# Patient Record
Sex: Female | Born: 1985 | Race: White | Hispanic: No | Marital: Single | Smoking: Former smoker
Health system: Southern US, Community
[De-identification: ages and names within clinical notes are randomized; demographics above are authoritative.]

## PROBLEM LIST (undated history)

## (undated) ENCOUNTER — Inpatient Hospital Stay (HOSPITAL_COMMUNITY): Payer: Self-pay

## (undated) DIAGNOSIS — Z789 Other specified health status: Secondary | ICD-10-CM

## (undated) HISTORY — PX: TONSILLECTOMY: SUR1361

## (undated) HISTORY — PX: NASAL SEPTUM SURGERY: SHX37

---

## 2015-08-19 ENCOUNTER — Inpatient Hospital Stay (HOSPITAL_COMMUNITY): Payer: Medicaid Other

## 2015-08-19 ENCOUNTER — Inpatient Hospital Stay (HOSPITAL_COMMUNITY)
Admission: AD | Admit: 2015-08-19 | Discharge: 2015-08-19 | Disposition: A | Discharge status: Home / Self Care | Payer: Medicaid Other | Source: Ambulatory Visit | Attending: Obstetrics & Gynecology | Admitting: Obstetrics & Gynecology

## 2015-08-19 ENCOUNTER — Encounter (HOSPITAL_COMMUNITY): Payer: Self-pay | Admitting: *Deleted

## 2015-08-19 DIAGNOSIS — N760 Acute vaginitis: Secondary | ICD-10-CM | POA: Insufficient documentation

## 2015-08-19 DIAGNOSIS — Z87891 Personal history of nicotine dependence: Secondary | ICD-10-CM | POA: Insufficient documentation

## 2015-08-19 DIAGNOSIS — O26899 Other specified pregnancy related conditions, unspecified trimester: Secondary | ICD-10-CM

## 2015-08-19 DIAGNOSIS — O23591 Infection of other part of genital tract in pregnancy, first trimester: Secondary | ICD-10-CM | POA: Diagnosis not present

## 2015-08-19 DIAGNOSIS — R102 Pelvic and perineal pain: Secondary | ICD-10-CM | POA: Diagnosis not present

## 2015-08-19 DIAGNOSIS — Z3A01 Less than 8 weeks gestation of pregnancy: Secondary | ICD-10-CM | POA: Insufficient documentation

## 2015-08-19 DIAGNOSIS — O3680X0 Pregnancy with inconclusive fetal viability, not applicable or unspecified: Secondary | ICD-10-CM

## 2015-08-19 DIAGNOSIS — R109 Unspecified abdominal pain: Secondary | ICD-10-CM

## 2015-08-19 DIAGNOSIS — O9989 Other specified diseases and conditions complicating pregnancy, childbirth and the puerperium: Secondary | ICD-10-CM

## 2015-08-19 HISTORY — DX: Other specified health status: Z78.9

## 2015-08-19 LAB — CBC
HCT: 39.1 % (ref 36.0–46.0)
Hemoglobin: 13.5 g/dL (ref 12.0–15.0)
MCH: 30.1 pg (ref 26.0–34.0)
MCHC: 34.5 g/dL (ref 30.0–36.0)
MCV: 87.3 fL (ref 78.0–100.0)
Platelets: 221 10*3/uL (ref 150–400)
RBC: 4.48 MIL/uL (ref 3.87–5.11)
RDW: 12.7 % (ref 11.5–15.5)
WBC: 11 10*3/uL — ABNORMAL HIGH (ref 4.0–10.5)

## 2015-08-19 LAB — URINALYSIS, ROUTINE W REFLEX MICROSCOPIC
BILIRUBIN URINE: NEGATIVE
Glucose, UA: NEGATIVE mg/dL
HGB URINE DIPSTICK: NEGATIVE
Ketones, ur: NEGATIVE mg/dL
Leukocytes, UA: NEGATIVE
Nitrite: NEGATIVE
PH: 5.5 (ref 5.0–8.0)
Protein, ur: NEGATIVE mg/dL

## 2015-08-19 LAB — WET PREP, GENITAL
Sperm: NONE SEEN
Trich, Wet Prep: NONE SEEN
Yeast Wet Prep HPF POC: NONE SEEN

## 2015-08-19 LAB — POCT PREGNANCY, URINE: Preg Test, Ur: POSITIVE — AB

## 2015-08-19 LAB — HCG, QUANTITATIVE, PREGNANCY: hCG, Beta Chain, Quant, S: 10120 m[IU]/mL — ABNORMAL HIGH (ref <5)

## 2015-08-19 LAB — ABO/RH: ABO/RH(D): A POS

## 2015-08-19 MED ORDER — METRONIDAZOLE 500 MG PO TABS: 500.0000 mg | ORAL_TABLET | Freq: Two times a day (BID) | ORAL | Status: DC

## 2015-08-19 NOTE — Discharge Instructions (Signed)
Human Chorionic Gonadotropin Test °Human chorionic gonadotropin (hCG) is a hormone produced during pregnancy by the cells that form the placenta. The placenta is the organ that grows inside your womb (uterus) to nourish a developing baby. When you are pregnant, hCG starts to appear in your blood about 11 days after conception. It continues to go up for the first 8-11 weeks of pregnancy.  °Your hCG level can be measured with several different types of tests. You may have: °· A urine test. °¨ hCG is eliminated from your body by your kidneys, so a urine test is one way to check for this hormone. °¨ A urine test only shows whether there is hCG in your urine. It does not measure how much. °¨ You may have a urine test to find out whether you are pregnant. °¨ A home pregnancy test detects whether there is hCG in your urine. °· A qualitative blood test. °¨ Like the urine test, this blood test only shows whether there is hCG in your blood. It does not measure how much. °¨ You may have this type of blood test to find out whether you are pregnant. °· A quantitative blood test. °¨ This type of blood test measures the amount of hCG in your blood. °¨ You may have this type of test to diagnose an abnormal pregnancy or determine whether you are at risk of, or have had, a failed pregnancy (miscarriage). °PREPARATION FOR TEST °For the urine test: °· Limit your fluid intake before the urine test as directed by your health care provider. °· Collect the sample the first time you urinate in the morning. °· Let your health care provider know if you have blood in your urine. This may interfere with the test result. °Some medicines may interfere with the urine and blood tests. Let your health care provider know about all the medicines you are taking. No additional preparation is required for the blood test.  °RESULTS °It is your responsibility to obtain your test results. Ask the lab or department performing the test when and how you will  get your results. Talk to your health care provider if you have any questions about your test results. °The results of the hCG urine test and the qualitative hCG blood test are either positive or negative. The results of the quantitative hCG blood test are reported as a number. hCG is measured in international units per liter (IU/L). °Meaning of Negative Test Results °A negative result on a urine or qualitative blood test could mean that you are not pregnant. It could also mean the test was done too early to detect hCG. If you still have other signs of pregnancy, the test should be repeated. °Meaning of Positive Test Results °A positive result on the urine or qualitative blood tests means you are most likely pregnant. Your health care provider may confirm your pregnancy with an imaging study of the inside of your uterus at 5-6 weeks (ultrasound).  °Range of Normal Values °Ranges for normal values for the quantitative hCG blood test may vary among different labs and hospitals. You should always check with your health care provider after having lab work or other tests done to discuss whether your values are considered within normal limits.  °· Less than 5 IU/L means it is most likely you are not pregnant. °· Greater than 25 IU/L means it is most likely you are pregnant. °Meaning of Results Outside Normal Value Ranges °If your hCG level on the quantitative test is not   what would be expected, you may have the test again. It may also be important for your health care provider to know whether your hCG level goes up or down over time. Common causes of results outside the normal range include:   Being pregnant with twins (hCG level is higher than expected).  Having an ectopic pregnancy (hCG rises more slowly than expected).  Miscarriage (hCG level falls).  Abnormal growths in the womb (hCG level is higher than expected).   This information is not intended to replace advice given to you by your health care  provider. Make sure you discuss any questions you have with your health care provider.   Document Released: 05/02/2004 Document Revised: 04/21/2014 Document Reviewed: 07/05/2013 Elsevier Interactive Patient Education 2016 [employer]. First Trimester of Pregnancy The first trimester of pregnancy is from week 1 until the end of week 12 (months 1 through 3). A week after a sperm fertilizes an egg, the egg will implant on the wall of the uterus. This embryo will begin to develop into a baby. Genes from you and your partner are forming the baby. The female genes determine whether the baby is a boy or a girl. At 6-8 weeks, the eyes and face are formed, and the heartbeat can be seen on ultrasound. At the end of 12 weeks, all the baby's organs are formed.  Now that you are pregnant, you will want to do everything you can to have a healthy baby. Two of the most important things are to get good prenatal care and to follow your health care provider's instructions. Prenatal care is all the medical care you receive before the baby's birth. This care will help prevent, find, and treat any problems during the pregnancy and childbirth. BODY CHANGES Your body goes through many changes during pregnancy. The changes vary from woman to woman.   You may gain or lose a couple of pounds at first.  You may feel sick to your stomach (nauseous) and throw up (vomit). If the vomiting is uncontrollable, call your health care provider.  You may tire easily.  You may develop headaches that can be relieved by medicines approved by your health care provider.  You may urinate more often. Painful urination may mean you have a bladder infection.  You may develop heartburn as a result of your pregnancy.  You may develop constipation because certain hormones are causing the muscles that push waste through your intestines to slow down.  You may develop hemorrhoids or swollen, bulging veins (varicose veins).  Your breasts may  begin to grow larger and become tender. Your nipples may stick out more, and the tissue that surrounds them (areola) may become darker.  Your gums may bleed and may be sensitive to brushing and flossing.  Dark spots or blotches (chloasma, mask of pregnancy) may develop on your face. This will likely fade after the baby is born.  Your menstrual periods will stop.  You may have a loss of appetite.  You may develop cravings for certain kinds of food.  You may have changes in your emotions from day to day, such as being excited to be pregnant or being concerned that something may go wrong with the pregnancy and baby.  You may have more vivid and strange dreams.  You may have changes in your hair. These can include thickening of your hair, rapid growth, and changes in texture. Some women also have hair loss during or after pregnancy, or hair that feels dry or thin.  Your hair will most likely return to normal after your baby is born. WHAT TO EXPECT AT YOUR PRENATAL VISITS During a routine prenatal visit:  You will be weighed to make sure you and the baby are growing normally.  Your blood pressure will be taken.  Your abdomen will be measured to track your baby's growth.  The fetal heartbeat will be listened to starting around week 10 or 12 of your pregnancy.  Test results from any previous visits will be discussed. Your health care provider may ask you:  How you are feeling.  If you are feeling the baby move.  If you have had any abnormal symptoms, such as leaking fluid, bleeding, severe headaches, or abdominal cramping.  If you are using any tobacco products, including cigarettes, chewing tobacco, and electronic cigarettes.  If you have any questions. Other tests that may be performed during your first trimester include:  Blood tests to find your blood type and to check for the presence of any previous infections. They will also be used to check for low iron levels (anemia) and  Rh antibodies. Later in the pregnancy, blood tests for diabetes will be done along with other tests if problems develop.  Urine tests to check for infections, diabetes, or protein in the urine.  An ultrasound to confirm the proper growth and development of the baby.  An amniocentesis to check for possible genetic problems.  Fetal screens for spina bifida and Down syndrome.  You may need other tests to make sure you and the baby are doing well.  HIV (human immunodeficiency virus) testing. Routine prenatal testing includes screening for HIV, unless you choose not to have this test. HOME CARE INSTRUCTIONS  Medicines  Follow your health care provider's instructions regarding medicine use. Specific medicines may be either safe or unsafe to take during pregnancy.  Take your prenatal vitamins as directed.  If you develop constipation, try taking a stool softener if your health care provider approves. Diet  Eat regular, well-balanced meals. Choose a variety of foods, such as meat or vegetable-based protein, fish, milk and low-fat dairy products, vegetables, fruits, and whole grain breads and cereals. Your health care provider will help you determine the amount of weight gain that is right for you.  Avoid raw meat and uncooked cheese. These carry germs that can cause birth defects in the baby.  Eating four or five small meals rather than three large meals a day may help relieve nausea and vomiting. If you start to feel nauseous, eating a few soda crackers can be helpful. Drinking liquids between meals instead of during meals also seems to help nausea and vomiting.  If you develop constipation, eat more high-fiber foods, such as fresh vegetables or fruit and whole grains. Drink enough fluids to keep your urine clear or pale yellow. Activity and Exercise  Exercise only as directed by your health care provider. Exercising will help you:  Control your weight.  Stay in shape.  Be prepared for  labor and delivery.  Experiencing pain or cramping in the lower abdomen or low back is a good sign that you should stop exercising. Check with your health care provider before continuing normal exercises.  Try to avoid standing for long periods of time. Move your legs often if you must stand in one place for a long time.  Avoid heavy lifting.  Wear low-heeled shoes, and practice good posture.  You may continue to have sex unless your health care provider directs you otherwise. Relief  of Pain or Discomfort  Wear a good support bra for breast tenderness.   Take warm sitz baths to soothe any pain or discomfort caused by hemorrhoids. Use hemorrhoid cream if your health care provider approves.   Rest with your legs elevated if you have leg cramps or low back pain.  If you develop varicose veins in your legs, wear support hose. Elevate your feet for 15 minutes, 3-4 times a day. Limit salt in your diet. Prenatal Care  Schedule your prenatal visits by the twelfth week of pregnancy. They are usually scheduled monthly at first, then more often in the last 2 months before delivery.  Write down your questions. Take them to your prenatal visits.  Keep all your prenatal visits as directed by your health care provider. Safety  Wear your seat belt at all times when driving.  Make a list of emergency phone numbers, including numbers for family, friends, the hospital, and police and fire departments. General Tips  Ask your health care provider for a referral to a local prenatal education class. Begin classes no later than at the beginning of month 6 of your pregnancy.  Ask for help if you have counseling or nutritional needs during pregnancy. Your health care provider can offer advice or refer you to specialists for help with various needs.  Do not use hot tubs, steam rooms, or saunas.  Do not douche or use tampons or scented sanitary pads.  Do not cross your legs for long periods of  time.  Avoid cat litter boxes and soil used by cats. These carry germs that can cause birth defects in the baby and possibly loss of the fetus by miscarriage or stillbirth.  Avoid all smoking, herbs, alcohol, and medicines not prescribed by your health care provider. Chemicals in these affect the formation and growth of the baby.  Do not use any tobacco products, including cigarettes, chewing tobacco, and electronic cigarettes. If you need help quitting, ask your health care provider. You may receive counseling support and other resources to help you quit.  Schedule a dentist appointment. At home, brush your teeth with a soft toothbrush and be gentle when you floss. SEEK MEDICAL CARE IF:   You have dizziness.  You have mild pelvic cramps, pelvic pressure, or nagging pain in the abdominal area.  You have persistent nausea, vomiting, or diarrhea.  You have a bad smelling vaginal discharge.  You have pain with urination.  You notice increased swelling in your face, hands, legs, or ankles. SEEK IMMEDIATE MEDICAL CARE IF:   You have a fever.  You are leaking fluid from your vagina.  You have spotting or bleeding from your vagina.  You have severe abdominal cramping or pain.  You have rapid weight gain or loss.  You vomit blood or material that looks like coffee grounds.  You are exposed to [place] measles and have never had them.  You are exposed to fifth disease or chickenpox.  You develop a severe headache.  You have shortness of breath.  You have any kind of trauma, such as from a fall or a car accident.   This information is not intended to replace advice given to you by your health care provider. Make sure you discuss any questions you have with your health care provider.   Document Released: 03/25/2001 Document Revised: 04/21/2014 Document Reviewed: 02/08/2013 Elsevier Interactive Patient Education [employer]

## 2015-08-19 NOTE — MAU Note (Signed)
Pt. States that she had some pink tinged discharge when using the bathroom. Pt. Is also having some lower abdominal cramping.

## 2015-08-19 NOTE — MAU Provider Note (Signed)
History     CSN: [id number]  Arrival date and time: 08/19/15 1526   None     Chief Complaint  Patient presents with  . Vaginal Bleeding   HPI Monique Cooper 30 y.o. G1P0 @ unknown LMP. Presents to the MAU stating that she had pink vaginal spotting from 1200 noon to 330pm today and lower abdominal cramping  Past Medical History  Diagnosis Date  . Medical history non-contributory     Past Surgical History  Procedure Laterality Date  . Tonsillectomy    . Nasal septum surgery      No family history on file.  Social History  Substance Use Topics  . Smoking status: Former Smoker    Quit date: 08/06/2015  . Smokeless tobacco: None  . Alcohol Use: No    Allergies: No Known Allergies  Prescriptions prior to admission  Medication Sig Dispense Refill Last Dose  . acetaminophen (TYLENOL) 500 MG tablet Take 1,000 mg by mouth every 6 (six) hours as needed for mild pain or headache.   Past Week at Unknown time  . Prenatal Vit-Fe Fumarate-FA (PRENATAL MULTIVITAMIN) TABS tablet Take 1 tablet by mouth daily.   08/19/2015 at Unknown time    Review of Systems  Constitutional: Negative for fever.  Gastrointestinal: Positive for abdominal pain.  Genitourinary:       Pink vaginal spotting.  All other systems reviewed and are negative.  Physical Exam   Blood pressure 143/76, pulse 82, temperature 99 F (37.2 C), temperature source Oral, height  (1.651 m), weight 225 lb 4 oz (102.173 kg), last menstrual period 08/08/2015.  Physical Exam  Nursing note and vitals reviewed. Constitutional: She is oriented to person, place, and time. She appears well-developed and well-nourished. No distress.  HENT:  Head: Normocephalic and atraumatic.  Cardiovascular: Normal rate and regular rhythm.   Respiratory: Effort normal and breath sounds normal. No respiratory distress.  GI: Soft. She exhibits no distension and no mass. There is no tenderness. There is no rebound and no guarding.   Genitourinary: Vagina normal. No vaginal discharge found.  Musculoskeletal: Normal range of motion.  Neurological: She is alert and oriented to person, place, and time.  Skin: Skin is warm and dry.  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 Her behavior is normal. Judgment and thought content normal.   US Ob Comp Less 14 Wks  08/19/2015  CLINICAL DATA:  30 year old pregnant female with pelvic pain and discharge. Uncertain LMP. EXAM: OBSTETRIC <14 WK [place] AND TRANSVAGINAL OB US TECHNIQUE: Both transabdominal and transvaginal ultrasound examinations were performed for complete evaluation of the gestation as well as the maternal uterus, adnexal regions, and pelvic cul-de-sac. Transvaginal technique was performed to assess early pregnancy. COMPARISON:  None. FINDINGS: Intrauterine gestational sac: Visualized/normal in shape. Yolk sac:  Visualized Embryo:  Not visualized Cardiac Activity: Not visualized MSD: 11  mm   5 w   6  d            [place] EDC: 04/14/2016 Maternal uterus/adnexae: There is no evidence of subchorionic hemorrhage. The ovaries bilaterally are unremarkable. There is no evidence of adnexal mass or free fluid. IMPRESSION: Single intrauterine gestational sac and yolk sac with estimated gestational age of [redacted] weeks 6 days by this ultrasound. Embryo not identified at this time. No evidence of subchorionic hemorrhage. Unremarkable ovaries. Electronically Signed   By: Harmon Pier M.D.   On: 08/19/2015 17:59   US Ob Transvaginal  08/19/2015  CLINICAL  DATA:  30 year old pregnant female with pelvic pain and discharge. Uncertain LMP. EXAM: OBSTETRIC <14 WK [place] AND TRANSVAGINAL OB US TECHNIQUE: Both transabdominal and transvaginal ultrasound examinations were performed for complete evaluation of the gestation as well as the maternal uterus, adnexal regions, and pelvic cul-de-sac. Transvaginal technique was performed to assess early pregnancy. COMPARISON:  None. FINDINGS:  Intrauterine gestational sac: Visualized/normal in shape. Yolk sac:  Visualized Embryo:  Not visualized Cardiac Activity: Not visualized MSD: 11  mm   5 w   6  d            [place] EDC: 04/14/2016 Maternal uterus/adnexae: There is no evidence of subchorionic hemorrhage. The ovaries bilaterally are unremarkable. There is no evidence of adnexal mass or free fluid. IMPRESSION: Single intrauterine gestational sac and yolk sac with estimated gestational age of [redacted] weeks 6 days by this ultrasound. Embryo not identified at this time. No evidence of subchorionic hemorrhage. Unremarkable ovaries. Electronically Signed   By: Harmon Pier M.D.   On: 08/19/2015 17:59   Results for orders placed or performed during the hospital encounter of 08/19/15 (from the past 24 hour(s))  Urinalysis, Routine w reflex microscopic (not at [facility])     Status: Abnormal   Collection Time: 08/19/15  3:35 PM  Result Value Ref Range   Color, Urine YELLOW YELLOW   APPearance CLEAR CLEAR   Specific Gravity, Urine <1.005 (L) 1.005 - 1.030   pH 5.5 5.0 - 8.0   Glucose, UA NEGATIVE NEGATIVE mg/dL   Hgb urine dipstick NEGATIVE NEGATIVE   Bilirubin Urine NEGATIVE NEGATIVE   Ketones, ur NEGATIVE NEGATIVE mg/dL   Protein, ur NEGATIVE NEGATIVE mg/dL   Nitrite NEGATIVE NEGATIVE   Leukocytes, UA NEGATIVE NEGATIVE  Pregnancy, urine POC     Status: Abnormal   Collection Time: 08/19/15  3:40 PM  Result Value Ref Range   Preg Test, Ur POSITIVE (A) NEGATIVE  CBC     Status: Abnormal   Collection Time: 08/19/15  4:52 PM  Result Value Ref Range   WBC 11.0 (H) 4.0 - 10.5 K/uL   RBC 4.48 3.87 - 5.11 MIL/uL   Hemoglobin 13.5 12.0 - 15.0 g/dL   HCT 16.1 09.6 - 04.5 %   MCV 87.3 78.0 - 100.0 fL   MCH 30.1 26.0 - 34.0 pg   MCHC 34.5 30.0 - 36.0 g/dL   RDW 40.9 81.1 - 91.4 %   Platelets 221 150 - 400 K/uL  hCG, quantitative, pregnancy     Status: Abnormal   Collection Time: 08/19/15  4:52 PM  Result Value Ref Range   hCG, Beta Chain, Quant, S 10120  (H) <5 mIU/mL  ABO/Rh     Status: None (Preliminary result)   Collection Time: 08/19/15  4:52 PM  Result Value Ref Range   ABO/RH(D) A POS   Wet prep, genital     Status: Abnormal   Collection Time: 08/19/15  6:00 PM  Result Value Ref Range   Yeast Wet Prep HPF POC NONE SEEN NONE SEEN   Trich, Wet Prep NONE SEEN NONE SEEN   Clue Cells Wet Prep HPF POC PRESENT (A) NONE SEEN   WBC, Wet Prep HPF POC FEW (A) NONE SEEN   Sperm NONE SEEN    MAU Course  Procedures  MDM Based on  lab results which confirm pregnancy and are suggestive for bacterial vaginitis, pt will be treated with Flagyl 500 mg PO bid X 7 days. Her U/S shows an intrauterine gestational sac and  yolk sac, but no embryo. She can follow up in 2 week with another u/s to determine viability. She is agreeable to POC.  Assessment and Plan  Abdominal Pain in Pregnancy Bacterial vaginitis  Flagyl 500 mg PO Bid x 7 d F/U Beta HCG in clinic in 48 hours F/U ultrasound in 2 week  Discharge to home   [facility] HospitalClemm[facility],Arnita Koons [place] 08/19/2015, 6:34 PM

## 2015-09-03 ENCOUNTER — Other Ambulatory Visit (HOSPITAL_COMMUNITY): Payer: Self-pay | Admitting: Certified Nurse Midwife

## 2015-09-03 ENCOUNTER — Ambulatory Visit (HOSPITAL_COMMUNITY)
Admission: RE | Admit: 2015-09-03 | Discharge: 2015-09-03 | Disposition: A | Discharge status: Home / Self Care | Payer: Medicaid Other | Source: Ambulatory Visit | Attending: Certified Nurse Midwife | Admitting: Certified Nurse Midwife

## 2015-09-03 ENCOUNTER — Ambulatory Visit (INDEPENDENT_AMBULATORY_CARE_PROVIDER_SITE_OTHER): Payer: Self-pay | Admitting: Obstetrics and Gynecology

## 2015-09-03 DIAGNOSIS — R109 Unspecified abdominal pain: Principal | ICD-10-CM

## 2015-09-03 DIAGNOSIS — Z3A01 Less than 8 weeks gestation of pregnancy: Secondary | ICD-10-CM | POA: Diagnosis not present

## 2015-09-03 DIAGNOSIS — Z36 Encounter for antenatal screening of mother: Secondary | ICD-10-CM | POA: Insufficient documentation

## 2015-09-03 DIAGNOSIS — Z3491 Encounter for supervision of normal pregnancy, unspecified, first trimester: Secondary | ICD-10-CM

## 2015-09-03 DIAGNOSIS — O3680X0 Pregnancy with inconclusive fetal viability, not applicable or unspecified: Secondary | ICD-10-CM

## 2015-09-03 DIAGNOSIS — O26899 Other specified pregnancy related conditions, unspecified trimester: Secondary | ICD-10-CM

## 2015-09-03 DIAGNOSIS — Z3490 Encounter for supervision of normal pregnancy, unspecified, unspecified trimester: Secondary | ICD-10-CM

## 2015-09-03 NOTE — Progress Notes (Signed)
CLINIC ENCOUNTER NOTE  History:  30 y.o. G1P0 here today for f/u u/s. Spotting 5/7, iup seen, 2 wk us ordered, had that today, normal iup with cardiac activity. No spotting, feeling well, taking pnv.  Past Medical History  Diagnosis Date  . Medical history non-contributory     Past Surgical History  Procedure Laterality Date  . Tonsillectomy    . Nasal septum surgery      The following portions of the patient's history were reviewed and updated as appropriate: allergies, current medications, past family history, past medical history, past social history, past surgical history and problem list.     Review of Systems:  See above; comprehensive review of systems was otherwise negative.  Objective:  Physical Exam LMP 08/08/2015 (Approximate) CONSTITUTIONAL: Well-developed, well-nourished female in no acute distress.     Labs and Imaging [place] Ob Comp Less 14 Wks  08/19/2015  CLINICAL DATA:  30 year old pregnant female with pelvic pain and discharge. Uncertain LMP. EXAM: OBSTETRIC <14 WK US AND TRANSVAGINAL OB US TECHNIQUE: Both transabdominal and transvaginal ultrasound examinations were performed for complete evaluation of the gestation as well as the maternal uterus, adnexal regions, and pelvic cul-de-sac. Transvaginal technique was performed to assess early pregnancy. COMPARISON:  None. FINDINGS: Intrauterine gestational sac: Visualized/normal in shape. Yolk sac:  Visualized Embryo:  Not visualized Cardiac Activity: Not visualized MSD: 11  mm   5 w   6  d            US EDC: 04/14/2016 Maternal uterus/adnexae: There is no evidence of subchorionic hemorrhage. The ovaries bilaterally are unremarkable. There is no evidence of adnexal mass or free fluid. IMPRESSION: Single intrauterine gestational sac and yolk sac with estimated gestational age of [redacted] weeks 6 days by this ultrasound. Embryo not identified at this time. No evidence of subchorionic hemorrhage. Unremarkable ovaries. Electronically  Signed   By: Harmon PierJeffrey  Hu M.D.   On: 08/19/2015 17:59   [place] Ob Transvaginal  09/03/2015  CLINICAL DATA:  30 year old pregnant female presents for assessment of fetal dating and viability. Uncertain LMP. EDC by first sonogram (by mean sac diameter): 04/14/2016, projecting to an expected gestational age of [redacted] weeks 0 days. EXAM: TRANSVAGINAL OB ULTRASOUND TECHNIQUE: Transvaginal ultrasound was performed for complete evaluation of the gestation as well as the maternal uterus, adnexal regions, and pelvic cul-de-sac. COMPARISON:  08/19/2015 obstetric scan. FINDINGS: Intrauterine gestational sac: Single intrauterine gestational sac appears normal in size, shape and position. Yolk sac:  Present. Embryo:  Present. Embryonic Cardiac Activity: Regular rate and rhythm. Embryonic Heart Rate: 164 bpm CRL:   14.5  mm   7 w 6 d                  US EDC: 04/15/2016 Subchorionic hemorrhage:  None visualized. Maternal uterus/adnexae: No uterine fibroids. Right ovary measures 3.9 x 2.0 x 2.2 cm. Left ovary measures 2.4 x 1.3 x 1.6 cm. No suspicious ovarian or adnexal masses. No abnormal free fluid the pelvis. IMPRESSION: 1. Single living intrauterine gestation at 7 weeks 6 days by crown-rump length. 2. No first-trimester gestational abnormality. Electronically Signed   By: Delbert PhenixJason A Poff M.D.   On: 09/03/2015 08:29   [place] Ob Transvaginal  08/19/2015  CLINICAL DATA:  30 year old pregnant female with pelvic pain and discharge. Uncertain LMP. EXAM: OBSTETRIC <14 WK US AND TRANSVAGINAL OB US TECHNIQUE: Both transabdominal and transvaginal ultrasound examinations were performed for complete evaluation of the gestation as well as the maternal uterus, adnexal regions, and pelvic  cul-de-sac. Transvaginal technique was performed to assess early pregnancy. COMPARISON:  None. FINDINGS: Intrauterine gestational sac: Visualized/normal in shape. Yolk sac:  Visualized Embryo:  Not visualized Cardiac Activity: Not visualized MSD: 11  mm   5 w   6  d             [place] EDC: 04/14/2016 Maternal uterus/adnexae: There is no evidence of subchorionic hemorrhage. The ovaries bilaterally are unremarkable. There is no evidence of adnexal mass or free fluid. IMPRESSION: Single intrauterine gestational sac and yolk sac with estimated gestational age of [redacted] weeks 6 days by this ultrasound. Embryo not identified at this time. No evidence of subchorionic hemorrhage. Unremarkable ovaries. Electronically Signed   By: Harmon Pier M.D.   On: 08/19/2015 17:59    Assessment & Plan:   # IUP @ ~ 8 wks - no significant medical comorbidities save for psoriasis, not on any medications currently - continue pnv - lrob f/u  Routine preventative health maintenance measures emphasized.     Monique Vorhees B. Malcome Ambrocio, MD OB/GYN Fellow [facility] [employer], [facility]

## 2016-06-23 ENCOUNTER — Encounter (HOSPITAL_COMMUNITY): Payer: Self-pay

## 2017-04-18 IMAGING — US US OB TRANSVAGINAL
1 series · 15 of 28 positions shown · non-contrast
Comparison: None.

CLINICAL DATA: 29-year-old pregnant female with pelvic pain and
discharge. Uncertain LMP.

EXAM:
OBSTETRIC <14 WK US AND TRANSVAGINAL OB US
TECHNIQUE: Both transabdominal and transvaginal ultrasound examinations were
performed for complete evaluation of the gestation as well as the
maternal uterus, adnexal regions, and pelvic cul-de-sac.
Transvaginal technique was performed to assess early pregnancy.

[Series 1: us ob transvaginal · 15 of 47 slices shown]
[im 1/47]
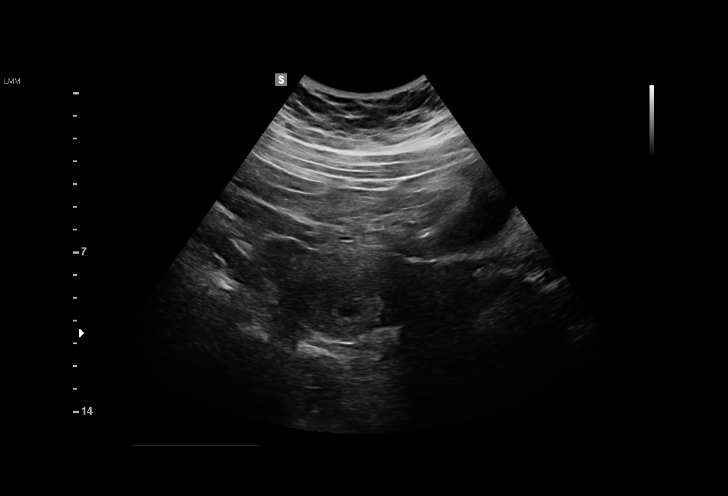
[im 4/47]
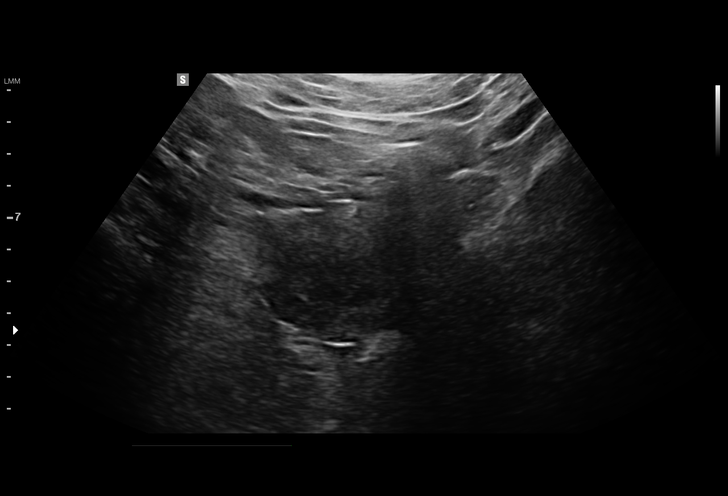
[im 7/47]
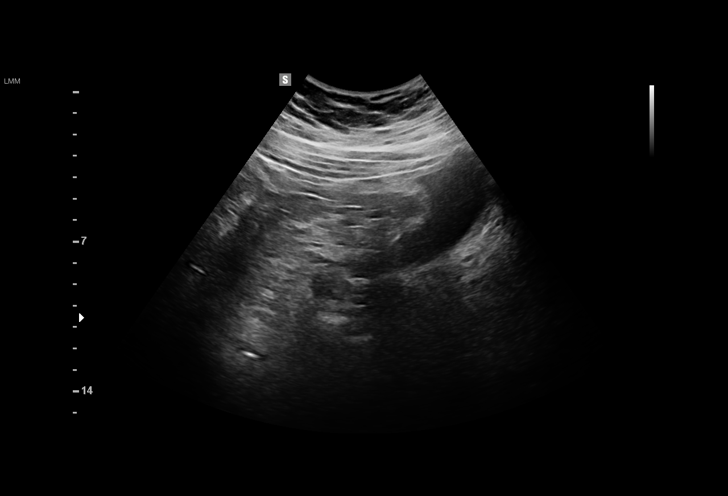
[im 11/47]
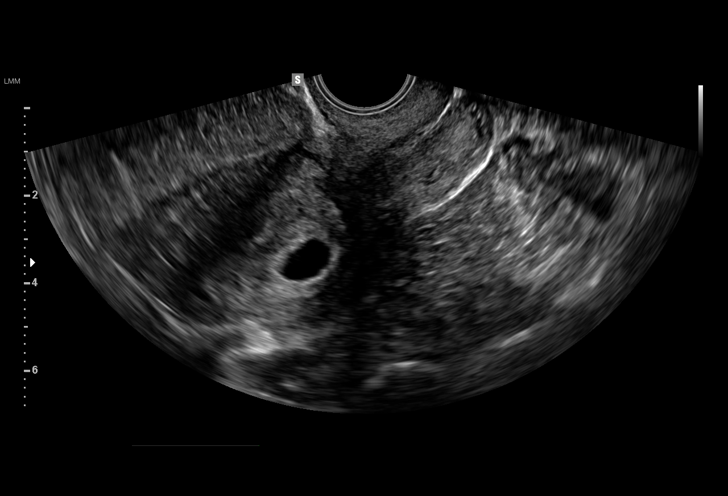
[im 14/47]
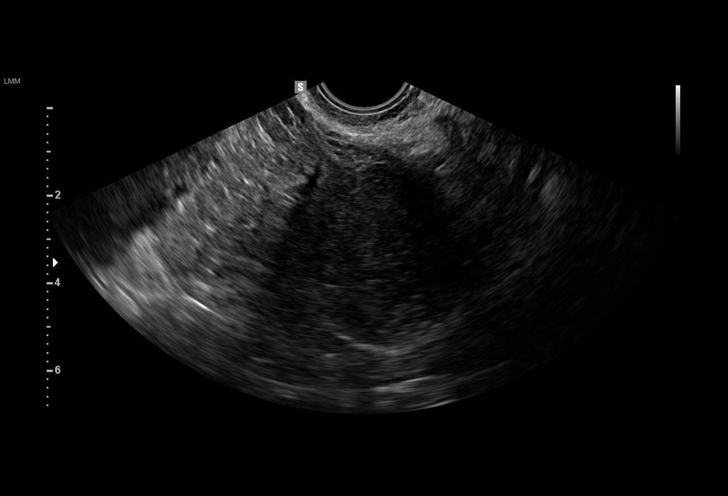
[im 18/47]
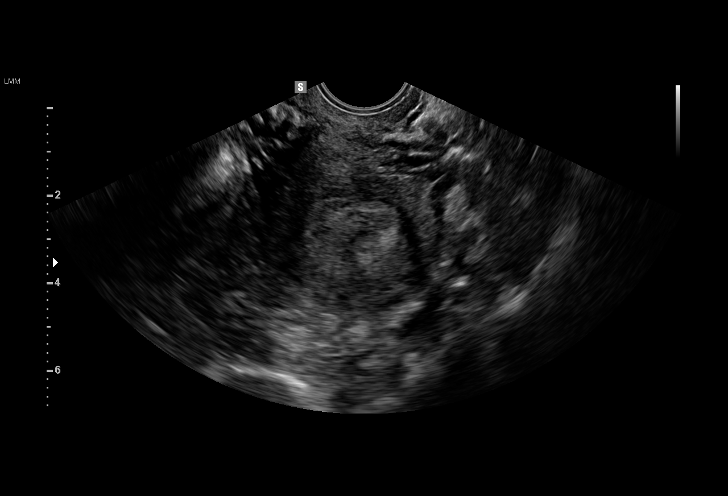
[im 21/47]
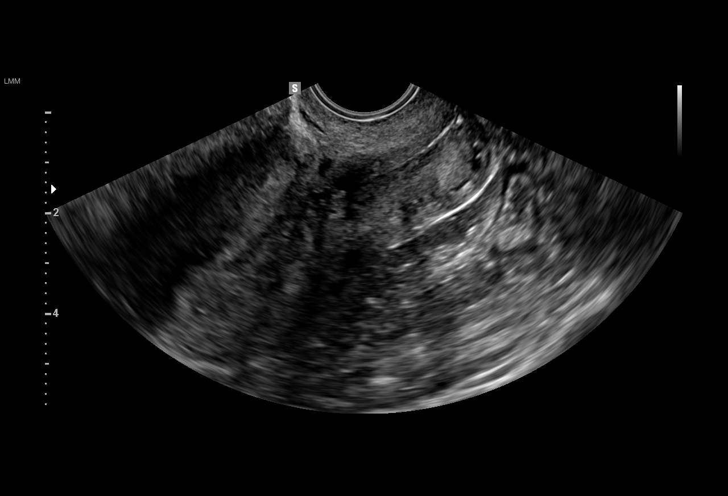
[im 24/47]
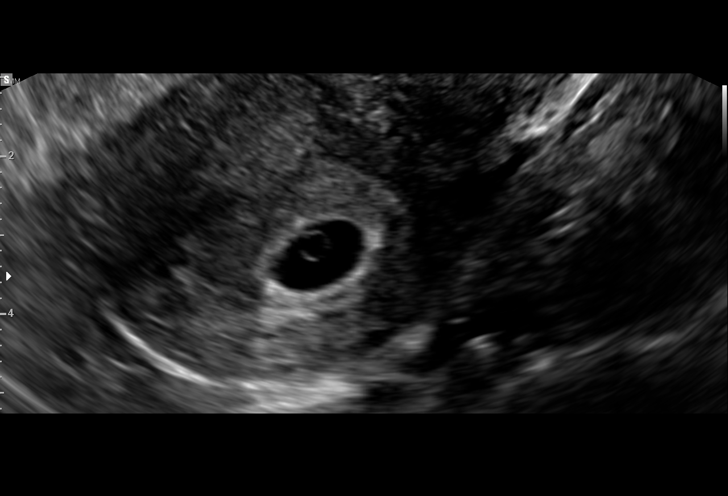
[im 26/47]
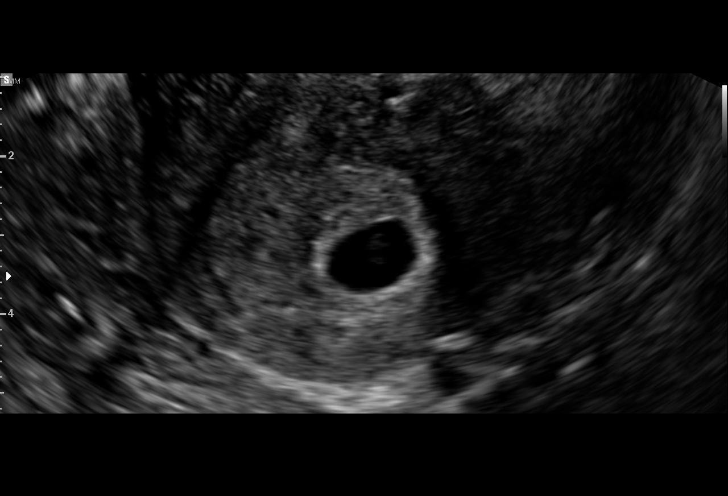
[im 29/47]
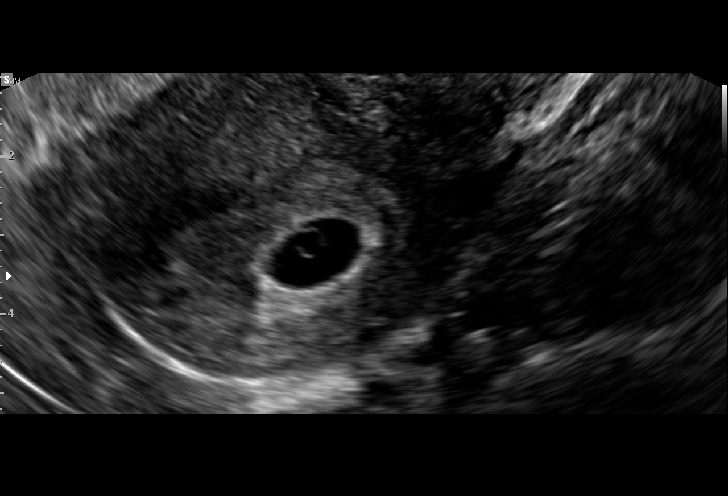
[im 33/47]
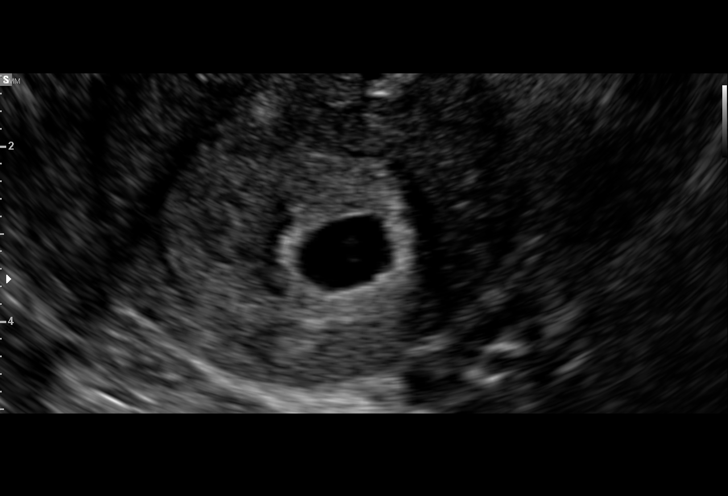
[im 36/47]
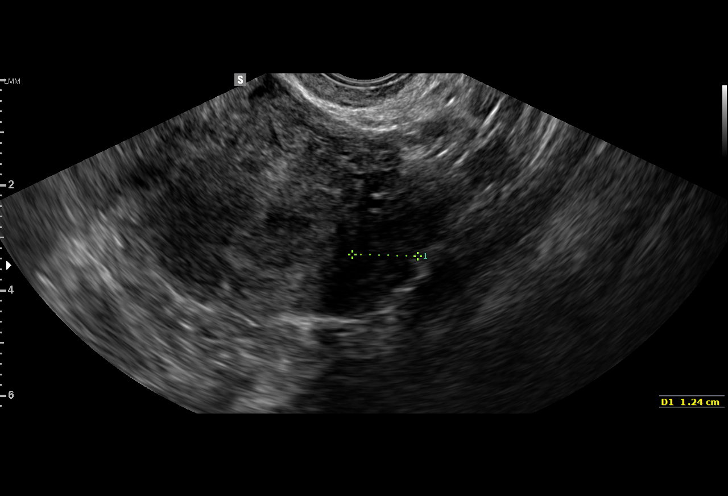
[im 40/47]
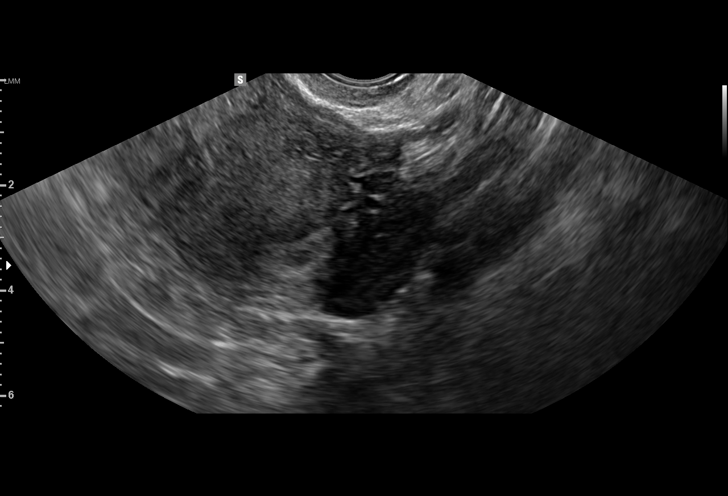
[im 43/47]
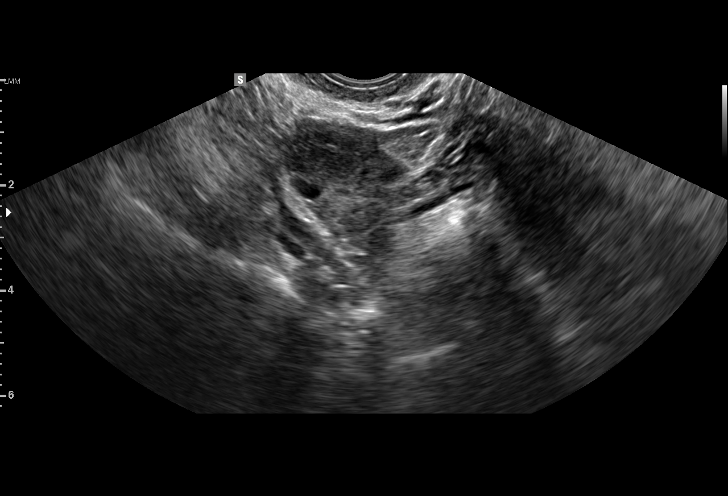
[im 47/47]
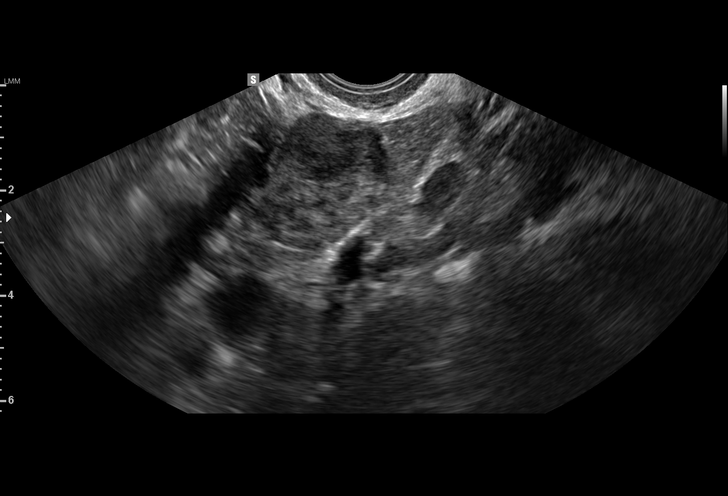

[15 of 28 positions shown; findings below may reference images not displayed]

FINDINGS: Intrauterine gestational sac: Visualized/normal in shape.

Yolk sac:  Visualized

Embryo:  Not visualized

Cardiac Activity: Not visualized

MSD: 11  mm   5 w   6  d            US EDC: 04/14/2016

Maternal uterus/adnexae: There is no evidence of subchorionic
hemorrhage.

The ovaries bilaterally are unremarkable.

There is no evidence of adnexal mass or free fluid.
IMPRESSION: Single intrauterine gestational sac and yolk sac with estimated
gestational age of 5 weeks 6 days by this ultrasound. Embryo not
identified at this time.

No evidence of subchorionic hemorrhage.

Unremarkable ovaries.

## 2017-05-03 IMAGING — US US OB TRANSVAGINAL
1 series · 15 of 22 positions shown · non-contrast
Comparison: 08/19/2015 obstetric scan.

CLINICAL DATA: 29-year-old pregnant female presents for assessment
of fetal dating and viability. Uncertain LMP.

EDC by first sonogram (by mean sac diameter): 04/14/2016, projecting
to an expected gestational age of 8 weeks 0 days.
EXAM:
TRANSVAGINAL OB ULTRASOUND
TECHNIQUE: Transvaginal ultrasound was performed for complete evaluation of the
gestation as well as the maternal uterus, adnexal regions, and
pelvic cul-de-sac.

[Series 1: us ob transvaginal · 22 acquisitions, 15 frames shown]
[im 1/22]
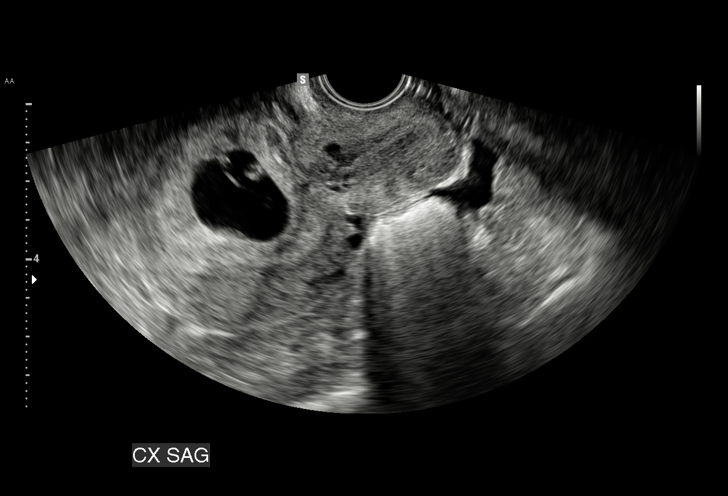
[im 3/22]
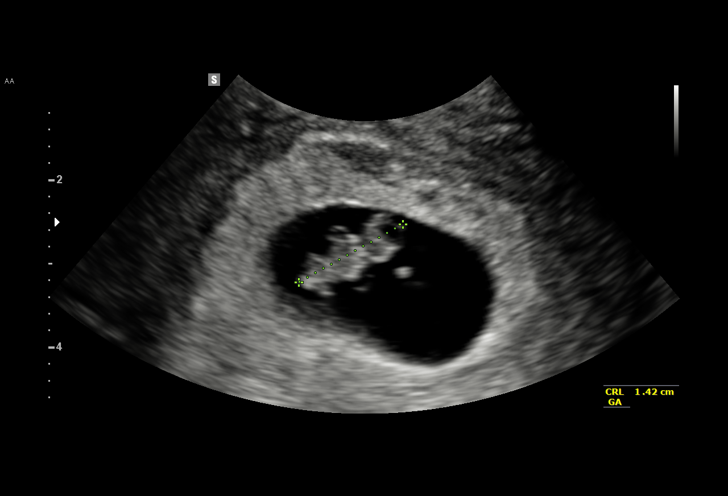
[im 4/22]
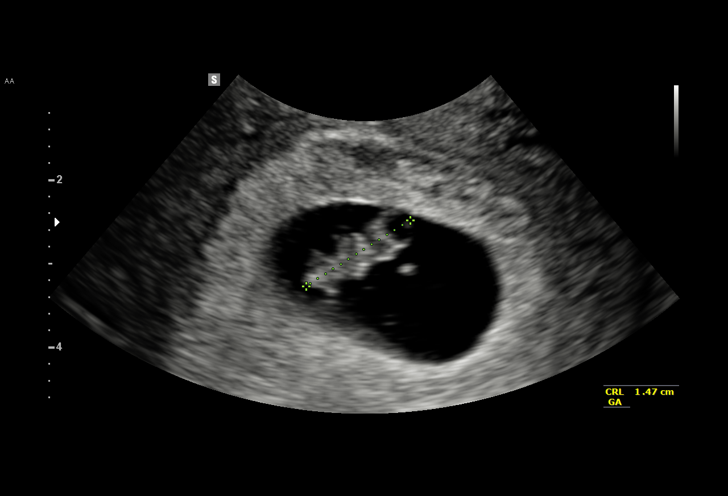
[im 6/22]
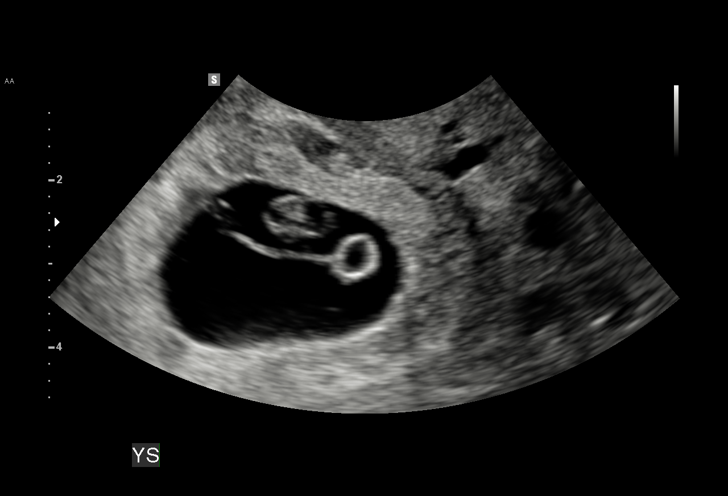
[im 7/22]
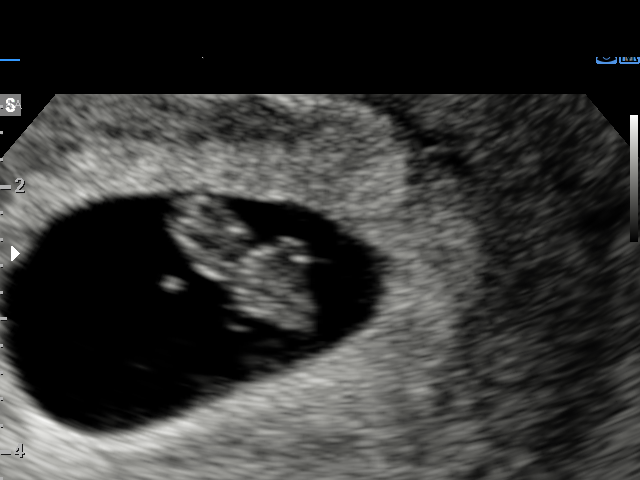
[im 9/22]
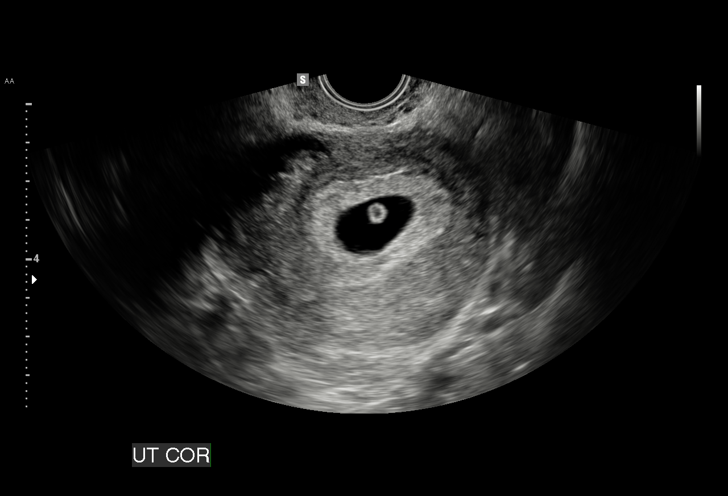
[im 10/22]
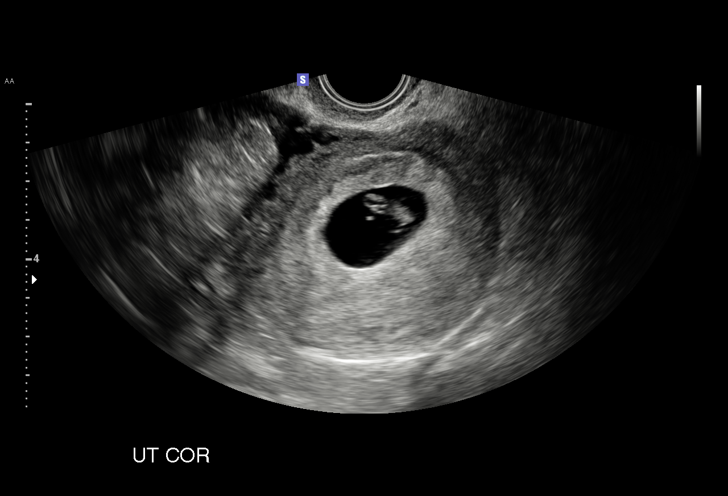
[im 12/22]
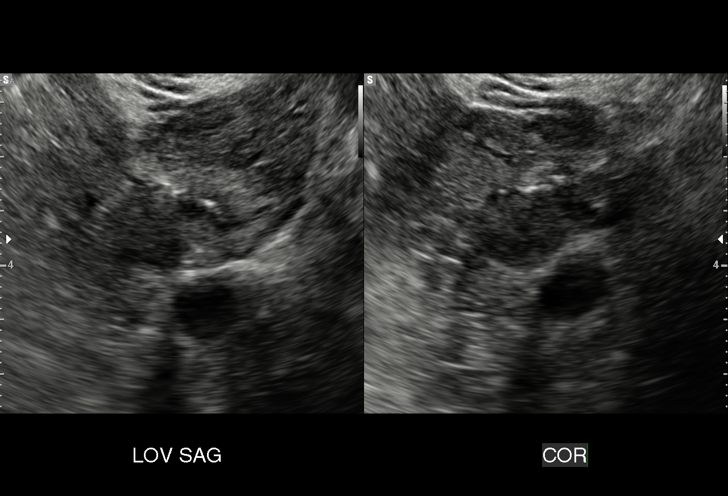
[im 13/22]
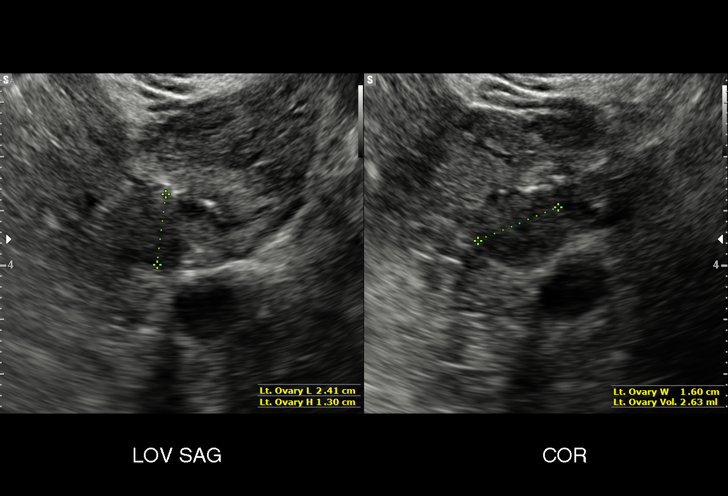
[im 14/22]
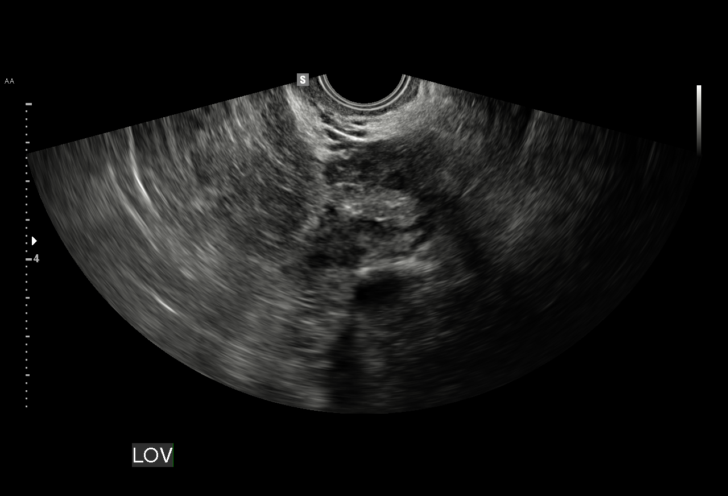
[im 16/22]
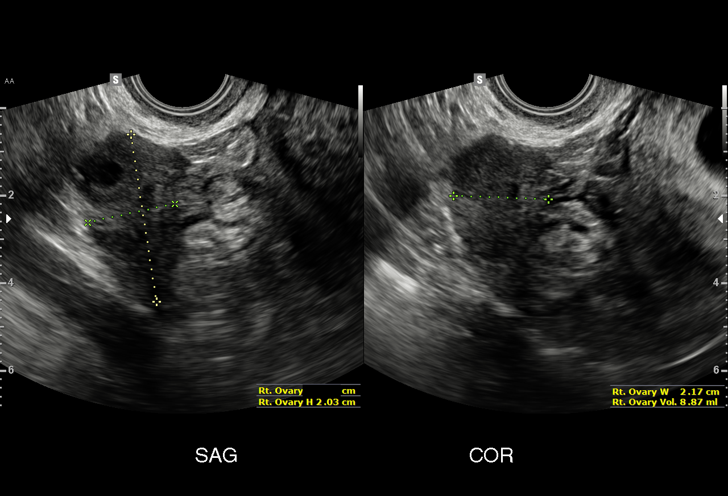
[im 17/22]
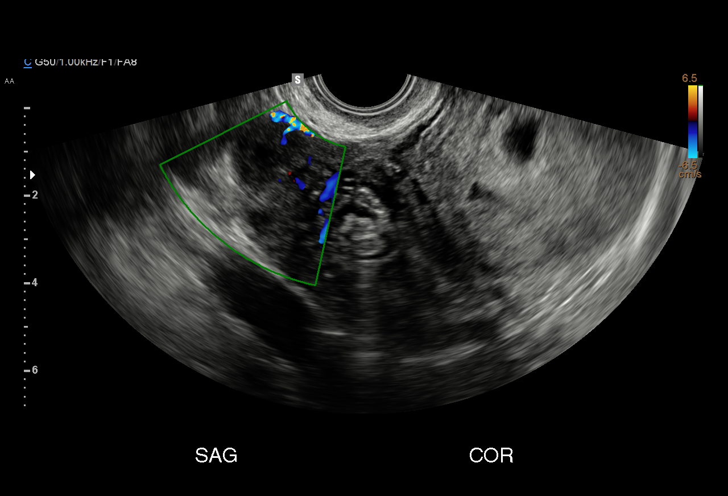
[im 19/22]
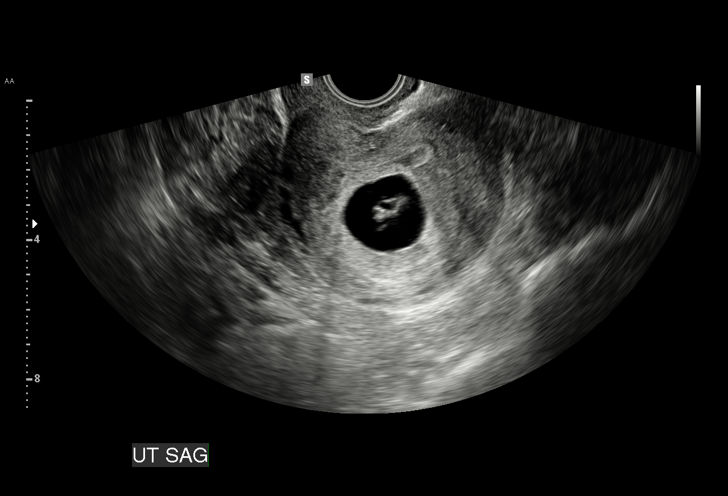
[im 20/22]
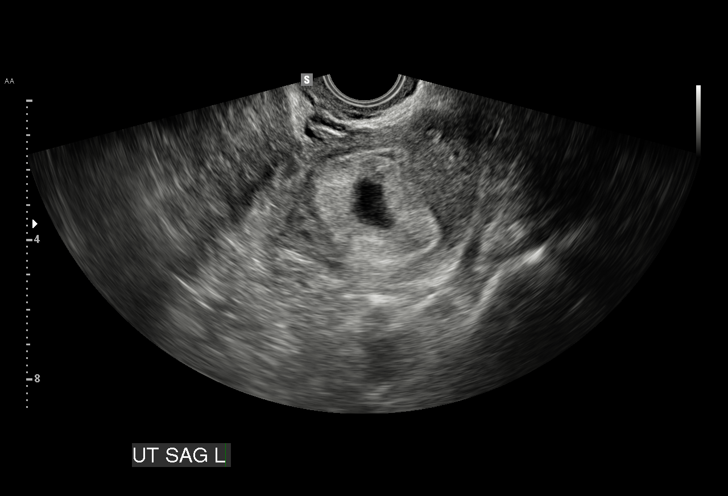
[im 22/22]
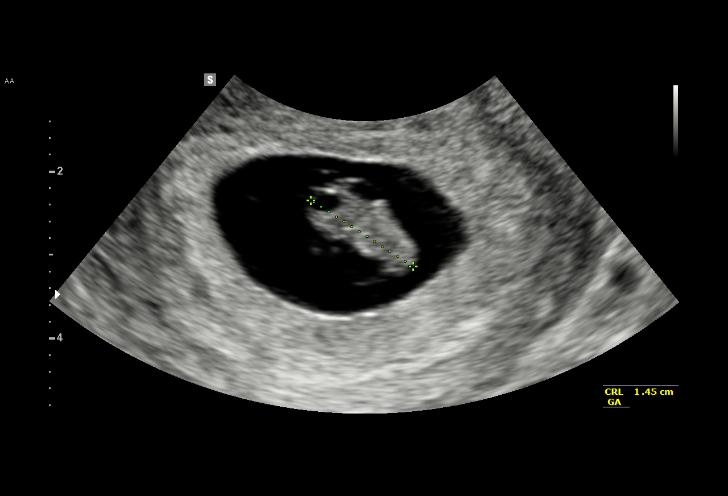

[15 of 22 positions shown; findings below may reference images not displayed]

FINDINGS: Intrauterine gestational sac: Single intrauterine gestational sac
appears normal in size, shape and position.

Yolk sac:  Present.

Embryo:  Present.

Embryonic Cardiac Activity: Regular rate and rhythm.

Embryonic Heart Rate: 164 bpm

CRL:   14.5  mm   7 w 6 d                  US EDC: 04/15/2016

Subchorionic hemorrhage:  None visualized.

Maternal uterus/adnexae: No uterine fibroids. Right ovary measures
3.9 x 2.0 x 2.2 cm. Left ovary measures 2.4 x 1.3 x 1.6 cm. No
suspicious ovarian or adnexal masses. No abnormal free fluid the
pelvis.
IMPRESSION: 1. Single living intrauterine gestation at 7 weeks 6 days by
crown-rump length.
2. No first-trimester gestational abnormality.

## 2017-05-07 ENCOUNTER — Ambulatory Visit: Payer: Self-pay | Admitting: Emergency Medicine

## 2017-05-07 VITALS — BP 128/78 | HR 71 | Temp 98.7°F | Resp 18 | Wt 253.6 lb

## 2017-05-07 DIAGNOSIS — J029 Acute pharyngitis, unspecified: Secondary | ICD-10-CM

## 2017-05-07 DIAGNOSIS — J069 Acute upper respiratory infection, unspecified: Secondary | ICD-10-CM

## 2017-05-07 LAB — POCT RAPID STREP A (OFFICE): Rapid Strep A Screen: NEGATIVE

## 2017-05-07 MED ORDER — AMOXICILLIN 500 MG PO CAPS: 1000.0000 mg | ORAL_CAPSULE | Freq: Two times a day (BID) | ORAL | 0 refills | Status: AC

## 2017-05-07 NOTE — Progress Notes (Signed)
Subjective:     Monique Cooper is a 32 y.o. female who presents for evaluation of symptoms of a URI. Symptoms include congestion, fever-duration 1  day, nasal congestion and sore throat. Fever has been 101.2, and has come down with Tylenol. Onset of symptoms was 1 day ago, and has been unchanged since that time. Treatment to date: nasal steroids and tylenol. Patient is currently [redacted] weeks pregnant, she is under the care of an OB/GYN  The following portions of the patient's history were reviewed and updated as appropriate: allergies and current medications.  Review of Systems Pertinent items noted in HPI and remainder of comprehensive ROS otherwise negative.   Objective:    BP 128/78 (BP Location: Right Arm, Patient Position: Sitting, Cuff Size: Large)   Pulse 71   Temp 98.7 F (37.1 C) (Oral)   Resp 18   Wt 253 lb 9.6 oz (115 kg)   SpO2 97%   BMI 42.20 kg/m  General appearance: alert, cooperative and appears stated age Head: Normocephalic, without obvious abnormality, atraumatic Eyes: negative Ears: normal TM's and external ear canals both ears Nose: Nares normal. Septum midline. Mucosa normal. No drainage or sinus tenderness. Throat: No edema or exudate, noted errythema posterior pharnax  Neck: no adenopathy Lungs: clear to auscultation bilaterally Heart: regular rate and rhythm Extremities: extremities normal, atraumatic, no cyanosis or edema Pulses: 2+ and symmetric Lymph nodes: No cervical adenopathy   Assessment:    viral upper respiratory illness   Plan:    Discussed diagnosis and treatment of URI. Discussed the importance of avoiding unnecessary antibiotic therapy. Suggested symptomatic OTC remedies. Nasal saline spray for congestion. Follow up as needed. Delayed fill antibiotic, recommend avoiding filling for 4-5 days or return for reevaluation

## 2017-05-07 NOTE — Patient Instructions (Addendum)
Viral Respiratory Infection Recommend waiting 4 days prior to filling the antibiotic, or returning for reevaluation if symptoms persist. An alternative is to follow up at [facility] on [address] as they do send out labs and throat cultures.    A viral respiratory infection is an illness that affects parts of the body used for breathing, like the lungs, nose, and throat. It is caused by a germ called a virus. Some examples of this kind of infection are:  A cold.  The flu (influenza).  A respiratory syncytial virus (RSV) infection.  How do I know if I have this infection? Most of the time this infection causes:  A stuffy or runny nose.  Yellow or green fluid in the nose.  A cough.  Sneezing.  Tiredness (fatigue).  Achy muscles.  A sore throat.  Sweating or chills.  A fever.  A headache.  How is this infection treated? If the flu is diagnosed early, it may be treated with an antiviral medicine. This medicine shortens the length of time a person has symptoms. Symptoms may be treated with over-the-counter and prescription medicines, such as:  Expectorants. These make it easier to cough up mucus.  Decongestant nasal sprays.  Doctors do not prescribe antibiotic medicines for viral infections. They do not work with this kind of infection. How do I know if I should stay home? To keep others from getting sick, stay home if you have:  A fever.  A lasting cough.  A sore throat.  A runny nose.  Sneezing.  Muscles aches.  Headaches.  Tiredness.  Weakness.  Chills.  Sweating.  An upset stomach (nausea).  Follow these instructions at home:  Rest as much as possible.  Take over-the-counter and prescription medicines only as told by your doctor.  Drink enough fluid to keep your pee (urine) clear or pale yellow.  Gargle with salt water. Do this 3-4 times per day or as needed. To make a salt-water mixture, dissolve -1 tsp of salt in 1  cup of warm water. Make sure the salt dissolves all the way.  Use nose drops made from salt water. This helps with stuffiness (congestion). It also helps soften the skin around your nose.  Do not drink alcohol.  Do not use tobacco products, including cigarettes, chewing tobacco, and e-cigarettes. If you need help quitting, ask your doctor. Get help if:  Your symptoms last for 10 days or longer.  Your symptoms get worse over time.  You have a fever.  You have very bad pain in your face or forehead.  Parts of your jaw or neck become very swollen. Get help right away if:  You feel pain or pressure in your chest.  You have shortness of breath.  You faint or feel like you will faint.  You keep throwing up (vomiting).  You feel confused. This information is not intended to replace advice given to you by your health care provider. Make sure you discuss any questions you have with your health care provider. Document Released: 03/13/2008 Document Revised: 09/06/2015 Document Reviewed: 09/06/2014 Elsevier Interactive Patient Education  2018 [employer]

## 2017-09-09 ENCOUNTER — Encounter (HOSPITAL_COMMUNITY): Payer: Self-pay | Admitting: *Deleted

## 2017-09-09 ENCOUNTER — Inpatient Hospital Stay (HOSPITAL_COMMUNITY)
Admission: AD | Admit: 2017-09-09 | Discharge: 2017-09-09 | Disposition: A | Discharge status: Home / Self Care | Payer: Medicaid Other | Source: Ambulatory Visit | Attending: Obstetrics & Gynecology | Admitting: Obstetrics & Gynecology

## 2017-09-09 ENCOUNTER — Other Ambulatory Visit: Payer: Self-pay

## 2017-09-09 DIAGNOSIS — R0602 Shortness of breath: Secondary | ICD-10-CM | POA: Diagnosis not present

## 2017-09-09 DIAGNOSIS — O9989 Other specified diseases and conditions complicating pregnancy, childbirth and the puerperium: Secondary | ICD-10-CM | POA: Diagnosis not present

## 2017-09-09 DIAGNOSIS — Z3A25 25 weeks gestation of pregnancy: Secondary | ICD-10-CM

## 2017-09-09 DIAGNOSIS — O26892 Other specified pregnancy related conditions, second trimester: Secondary | ICD-10-CM | POA: Diagnosis not present

## 2017-09-09 DIAGNOSIS — Z79899 Other long term (current) drug therapy: Secondary | ICD-10-CM | POA: Insufficient documentation

## 2017-09-09 DIAGNOSIS — Z9889 Other specified postprocedural states: Secondary | ICD-10-CM | POA: Insufficient documentation

## 2017-09-09 DIAGNOSIS — R000 Tachycardia, unspecified: Secondary | ICD-10-CM

## 2017-09-09 DIAGNOSIS — O99891 Other specified diseases and conditions complicating pregnancy: Secondary | ICD-10-CM

## 2017-09-09 DIAGNOSIS — Z87891 Personal history of nicotine dependence: Secondary | ICD-10-CM | POA: Insufficient documentation

## 2017-09-09 LAB — URINALYSIS, ROUTINE W REFLEX MICROSCOPIC
Bilirubin Urine: NEGATIVE
GLUCOSE, UA: NEGATIVE mg/dL
Hgb urine dipstick: NEGATIVE
Ketones, ur: NEGATIVE mg/dL
NITRITE: NEGATIVE
PH: 5 (ref 5.0–8.0)
Protein, ur: NEGATIVE mg/dL
SPECIFIC GRAVITY, URINE: 1.028 (ref 1.005–1.030)

## 2017-09-09 LAB — COMPREHENSIVE METABOLIC PANEL
ALT: 18 U/L (ref 14–54)
AST: 18 U/L (ref 15–41)
Albumin: 3.3 g/dL — ABNORMAL LOW (ref 3.5–5.0)
Alkaline Phosphatase: 46 U/L (ref 38–126)
Anion gap: 11 (ref 5–15)
BILIRUBIN TOTAL: 0.4 mg/dL (ref 0.3–1.2)
BUN: 8 mg/dL (ref 6–20)
CHLORIDE: 104 mmol/L (ref 101–111)
CO2: 21 mmol/L — ABNORMAL LOW (ref 22–32)
CREATININE: 0.46 mg/dL (ref 0.44–1.00)
Calcium: 8.8 mg/dL — ABNORMAL LOW (ref 8.9–10.3)
Glucose, Bld: 121 mg/dL — ABNORMAL HIGH (ref 65–99)
Potassium: 3.3 mmol/L — ABNORMAL LOW (ref 3.5–5.1)
Sodium: 136 mmol/L (ref 135–145)
TOTAL PROTEIN: 6.6 g/dL (ref 6.5–8.1)

## 2017-09-09 LAB — CBC
HEMATOCRIT: 36 % (ref 36.0–46.0)
Hemoglobin: 11.9 g/dL — ABNORMAL LOW (ref 12.0–15.0)
MCH: 29.8 pg (ref 26.0–34.0)
MCHC: 33.1 g/dL (ref 30.0–36.0)
MCV: 90.2 fL (ref 78.0–100.0)
Platelets: 200 10*3/uL (ref 150–400)
RBC: 3.99 MIL/uL (ref 3.87–5.11)
RDW: 13.4 % (ref 11.5–15.5)
WBC: 10.2 10*3/uL (ref 4.0–10.5)

## 2017-09-09 NOTE — MAU Note (Signed)
Was at work, her heart started racing, started having SOB.  Called her dr, they were closing was told to go be seen in the ER.  Denies hx.

## 2017-09-09 NOTE — MAU Provider Note (Addendum)
History     CSN: [id number]  Arrival date and time: 09/09/17 1709    Chief Complaint  Patient presents with  . Shortness of Breath  . Tachycardia   G2P1001 .1 wks here with SOB and heart racing. Sx started today around 4:30pm. Sx started while she was sitting, working at her desk. Associated sx are pain in her left deltoid which has since resolved. No CP, jaw pain, or numbness. States she has drank 3 tall cups of water and eating well today. Reports good FM. No VB, LOF, or ctx. She is getting care in [place], no records on file. She reports an uncomplicated pregnancy. Denies any history of heart problems. She feels better since arrival.   OB History    Gravida  2   Para  1   Term  1   Preterm      AB      Living  1     SAB      TAB      Ectopic      Multiple      Live Births  1           Past Medical History:  Diagnosis Date  . Medical history non-contributory     Past Surgical History:  Procedure Laterality Date  . CESAREAN SECTION    . NASAL SEPTUM SURGERY    . TONSILLECTOMY      History reviewed. No pertinent family history.  Social History   Tobacco Use  . Smoking status: Former Smoker    Last attempt to quit: 08/06/2015    Years since quitting: 2.0  . Smokeless tobacco: Never Used  Substance Use Topics  . Alcohol use: No  . Drug use: No    Allergies: No Known Allergies  Medications Prior to Admission  Medication Sig Dispense Refill Last Dose  . acetaminophen (TYLENOL) 500 MG tablet Take 1,000 mg by mouth every 6 (six) hours as needed for mild pain or headache.   Not Taking  . metroNIDAZOLE (FLAGYL) 500 MG tablet Take 1 tablet (500 mg total) by mouth 2 (two) times daily. (Patient not taking: Reported on 05/07/2017) 14 tablet 0 Not Taking  . Prenatal Vit-Fe Fumarate-FA (PRENATAL MULTIVITAMIN) TABS tablet Take 1 tablet by mouth daily.   Taking    Review of Systems  Constitutional: Negative for fever.  Respiratory: Positive for  shortness of breath. Negative for cough and chest tightness.   Cardiovascular: Negative for chest pain, palpitations and leg swelling.  Gastrointestinal: Negative for abdominal pain.  Genitourinary: Negative for vaginal bleeding and vaginal discharge.   Physical Exam   Blood pressure 132/71, pulse 90, temperature 98.3 F (36.8 C), temperature source Oral, resp. rate (!) 22, weight 275 lb 12 oz (125.1 kg), SpO2 98 %, unknown if currently breastfeeding.  Physical Exam  Constitutional: She is oriented to person, place, and time. She appears well-developed and well-nourished. No distress.  HENT:  Head: Normocephalic and atraumatic.  Neck: Normal range of motion.  Cardiovascular: Normal rate, regular rhythm and normal heart sounds.  Respiratory: Effort normal and breath sounds normal. No respiratory distress. She has no wheezes. She has no rales.  GI: Soft. She exhibits no distension. There is no tenderness.  Gravid, pendulous  Musculoskeletal: Normal range of motion. She exhibits no edema.  Neurological: She is alert and oriented to person, place, and time.  Skin: Skin is warm and dry.  Psychiatric: She has a normal mood and affect.  EFM: 145 bpm, mod variability, +  accels, no decels Toco: none  Results for orders placed or performed during the hospital encounter of 09/09/17 (from the past 24 hour(s))  Urinalysis, Routine w reflex microscopic     Status: Abnormal   Collection Time: 09/09/17  5:32 PM  Result Value Ref Range   Color, Urine YELLOW YELLOW   APPearance CLOUDY (A) CLEAR   Specific Gravity, Urine 1.028 1.005 - 1.030   pH 5.0 5.0 - 8.0   Glucose, UA NEGATIVE NEGATIVE mg/dL   Hgb urine dipstick NEGATIVE NEGATIVE   Bilirubin Urine NEGATIVE NEGATIVE   Ketones, ur NEGATIVE NEGATIVE mg/dL   Protein, ur NEGATIVE NEGATIVE mg/dL   Nitrite NEGATIVE NEGATIVE   Leukocytes, UA SMALL (A) NEGATIVE   WBC, UA 11-20 0 - 5 WBC/hpf   Bacteria, UA FEW (A) NONE SEEN   Squamous Epithelial /  LPF 21-50 0 - 5   Mucus PRESENT   CBC     Status: Abnormal   Collection Time: 09/09/17  6:24 PM  Result Value Ref Range   WBC 10.2 4.0 - 10.5 K/uL   RBC 3.99 3.87 - 5.11 MIL/uL   Hemoglobin 11.9 (L) 12.0 - 15.0 g/dL   HCT 29.5 62.1 - 30.8 %   MCV 90.2 78.0 - 100.0 fL   MCH 29.8 26.0 - 34.0 pg   MCHC 33.1 30.0 - 36.0 g/dL   RDW 65.7 84.6 - 96.2 %   Platelets 200 150 - 400 K/uL  Comprehensive metabolic panel     Status: Abnormal   Collection Time: 09/09/17  6:24 PM  Result Value Ref Range   Sodium 136 135 - 145 mmol/L   Potassium 3.3 (L) 3.5 - 5.1 mmol/L   Chloride 104 101 - 111 mmol/L   CO2 21 (L) 22 - 32 mmol/L   Glucose, Bld 121 (H) 65 - 99 mg/dL   BUN 8 6 - 20 mg/dL   Creatinine, Ser 9.52 0.44 - 1.00 mg/dL   Calcium 8.8 (L) 8.9 - 10.3 mg/dL   Total Protein 6.6 6.5 - 8.1 g/dL   Albumin 3.3 (L) 3.5 - 5.0 g/dL   AST 18 15 - 41 U/L   ALT 18 14 - 54 U/L   Alkaline Phosphatase 46 38 - 126 U/L   Total Bilirubin 0.4 0.3 - 1.2 mg/dL   GFR calc non Af Amer >60 >60 mL/min   GFR calc Af Amer >60 >60 mL/min   Anion gap 11 5 - 15   MAU Course  Procedures  MDM Labs and EKG ordered. EKG nml. Dehydration noted on UA and may have caused sx. No cardiac pathology identified. Stable for discharge home.   Addendum: EKG report filed to chart (time stamped 18:11:31) which showed prolonged QT interval, was not given to provider for review at time of encounter. Provider received EKG prelim (time stamped 18:11:59) which showed NSR, Normal EKG.  Media Information       Assessment and Plan   1. [redacted] weeks gestation of pregnancy   2. Racing heart beat   3. Shortness of breath during pregnancy    Discharge home Follow up with Dr. Shawnie Pons as scheduled Return to ED for recurrence of sx Hydrate  Allergies as of 09/09/2017   No Known Allergies     Medication List    STOP taking these medications   metroNIDAZOLE 500 MG tablet Commonly known as:  FLAGYL     TAKE these medications    acetaminophen 500 MG tablet Commonly known as:  TYLENOL Take 1,000  mg by mouth every 6 (six) hours as needed for mild pain or headache.   prenatal multivitamin Tabs tablet Take 1 tablet by mouth daily.      Donette Larry, CNM 09/09/2017, 7:29 PM

## 2017-09-12 NOTE — Progress Notes (Signed)
Consult with Dr. Anne FuSkains (Cardiology) regarding abnormal EKG completed on 09/09/17. MD reviewed EKG (time stamped 18:11:31). No concerns identified. No further f/u recommended.

## 2018-03-16 ENCOUNTER — Encounter (HOSPITAL_COMMUNITY): Payer: Self-pay

## 2019-06-19 ENCOUNTER — Ambulatory Visit: Payer: Medicaid Other | Attending: Internal Medicine

## 2019-06-19 DIAGNOSIS — Z23 Encounter for immunization: Secondary | ICD-10-CM | POA: Insufficient documentation

## 2019-06-19 NOTE — Progress Notes (Signed)
   Covid-19 Vaccination Clinic  Name:  Kay Shippy    MRN: [id number] DOB: Feb 09, 1986  06/19/2019  Ms. Keiffer was observed post Covid-19 immunization for 15 minutes without incident. She was provided with Vaccine Information Sheet and instruction to access the V-Safe system.   Ms. Mcmackin was instructed to call 911 with any severe reactions post vaccine: Marland Kitchen Difficulty breathing  . Swelling of face and throat  . A fast heartbeat  . A bad rash all over body  . Dizziness and weakness   Immunizations Administered    Name Date Dose VIS Date Route   Pfizer COVID-19 Vaccine 06/19/2019 11:30 AM 0.3 mL 03/25/2019 Intramuscular   Manufacturer: [employer], [employer]   Lot: [id number]   NDC: [address]-2

## 2019-07-19 ENCOUNTER — Ambulatory Visit: Payer: Medicaid Other | Attending: Internal Medicine

## 2019-07-19 DIAGNOSIS — Z23 Encounter for immunization: Secondary | ICD-10-CM

## 2019-07-19 NOTE — Progress Notes (Signed)
   Covid-19 Vaccination Clinic  Name:  Monique Cooper    MRN: [id number] DOB: [birth year]  07/19/2019  Monique Cooper was observed post Covid-19 immunization for 15 minutes without incident. She was provided with Vaccine Information Sheet and instruction to access the V-Safe system.   Monique Cooper was instructed to call 911 with any severe reactions post vaccine: Marland Kitchen Difficulty breathing  . Swelling of face and throat  . A fast heartbeat  . A bad rash all over body  . Dizziness and weakness   Immunizations Administered    Name Date Dose VIS Date Route   Pfizer COVID-19 Vaccine 07/19/2019 11:49 AM 0.3 mL 03/25/2019 Intramuscular   Manufacturer: [employer], [employer]   Lot: [id number]   NDC: [address]-4
